# Patient Record
Sex: Female | Born: 1935 | Race: White | Hispanic: No | Smoking: Never smoker
Health system: Southern US, Community
[De-identification: ages and names within clinical notes are randomized; demographics above are authoritative.]

## PROBLEM LIST (undated history)

## (undated) DIAGNOSIS — R627 Adult failure to thrive: Secondary | ICD-10-CM

## (undated) DIAGNOSIS — E785 Hyperlipidemia, unspecified: Secondary | ICD-10-CM

## (undated) DIAGNOSIS — F329 Major depressive disorder, single episode, unspecified: Secondary | ICD-10-CM

## (undated) DIAGNOSIS — K5900 Constipation, unspecified: Secondary | ICD-10-CM

## (undated) DIAGNOSIS — G219 Secondary parkinsonism, unspecified: Secondary | ICD-10-CM

## (undated) DIAGNOSIS — K210 Gastro-esophageal reflux disease with esophagitis: Secondary | ICD-10-CM

## (undated) DIAGNOSIS — F0150 Vascular dementia without behavioral disturbance: Secondary | ICD-10-CM

## (undated) DIAGNOSIS — N12 Tubulo-interstitial nephritis, not specified as acute or chronic: Secondary | ICD-10-CM

## (undated) DIAGNOSIS — K769 Liver disease, unspecified: Secondary | ICD-10-CM

## (undated) HISTORY — DX: Constipation, unspecified: K59.00

## (undated) HISTORY — DX: Liver disease, unspecified: K76.9

## (undated) HISTORY — DX: Gastro-esophageal reflux disease with esophagitis: K21.0

## (undated) HISTORY — DX: Hyperlipidemia, unspecified: E78.5

## (undated) HISTORY — DX: Major depressive disorder, single episode, unspecified: F32.9

## (undated) HISTORY — DX: Tubulo-interstitial nephritis, not specified as acute or chronic: N12

## (undated) HISTORY — DX: Vascular dementia without behavioral disturbance: F01.50

## (undated) HISTORY — DX: Adult failure to thrive: R62.7

## (undated) HISTORY — DX: Secondary parkinsonism, unspecified: G21.9

---

## 2000-11-26 ENCOUNTER — Emergency Department (HOSPITAL_COMMUNITY): Admission: EM | Admit: 2000-11-26 | Discharge: 2000-11-26 | Payer: Self-pay | Admitting: Emergency Medicine

## 2001-04-13 ENCOUNTER — Encounter: Payer: Self-pay | Admitting: Emergency Medicine

## 2001-04-14 ENCOUNTER — Inpatient Hospital Stay (HOSPITAL_COMMUNITY): Admission: EM | Admit: 2001-04-14 | Discharge: 2001-04-16 | Payer: Self-pay | Admitting: Emergency Medicine

## 2001-04-15 ENCOUNTER — Encounter: Payer: Self-pay | Admitting: Orthopedic Surgery

## 2002-10-28 ENCOUNTER — Inpatient Hospital Stay (HOSPITAL_COMMUNITY): Admission: EM | Admit: 2002-10-28 | Discharge: 2002-11-17 | Payer: Self-pay | Admitting: Emergency Medicine

## 2002-10-28 ENCOUNTER — Encounter: Payer: Self-pay | Admitting: Emergency Medicine

## 2002-10-28 ENCOUNTER — Encounter: Payer: Self-pay | Admitting: Cardiology

## 2002-10-28 ENCOUNTER — Encounter: Payer: Self-pay | Admitting: Internal Medicine

## 2002-10-29 ENCOUNTER — Encounter: Payer: Self-pay | Admitting: Internal Medicine

## 2002-10-30 ENCOUNTER — Encounter: Payer: Self-pay | Admitting: Internal Medicine

## 2002-10-30 ENCOUNTER — Encounter: Payer: Self-pay | Admitting: Pulmonary Disease

## 2002-10-31 ENCOUNTER — Encounter: Payer: Self-pay | Admitting: Internal Medicine

## 2002-11-01 ENCOUNTER — Encounter: Payer: Self-pay | Admitting: Internal Medicine

## 2002-11-02 ENCOUNTER — Encounter: Payer: Self-pay | Admitting: Internal Medicine

## 2002-11-03 ENCOUNTER — Encounter: Payer: Self-pay | Admitting: Internal Medicine

## 2002-11-05 ENCOUNTER — Encounter: Payer: Self-pay | Admitting: Internal Medicine

## 2002-11-06 ENCOUNTER — Encounter: Payer: Self-pay | Admitting: Internal Medicine

## 2002-11-09 ENCOUNTER — Encounter: Payer: Self-pay | Admitting: Internal Medicine

## 2002-11-10 ENCOUNTER — Encounter: Payer: Self-pay | Admitting: Internal Medicine

## 2002-11-13 ENCOUNTER — Encounter: Payer: Self-pay | Admitting: Internal Medicine

## 2002-11-18 ENCOUNTER — Encounter: Payer: Self-pay | Admitting: Emergency Medicine

## 2002-11-18 ENCOUNTER — Emergency Department (HOSPITAL_COMMUNITY): Admission: EM | Admit: 2002-11-18 | Discharge: 2002-11-18 | Payer: Self-pay | Admitting: Emergency Medicine

## 2002-11-22 ENCOUNTER — Emergency Department (HOSPITAL_COMMUNITY): Admission: EM | Admit: 2002-11-22 | Discharge: 2002-11-23 | Payer: Self-pay | Admitting: Emergency Medicine

## 2002-11-22 ENCOUNTER — Encounter: Payer: Self-pay | Admitting: Emergency Medicine

## 2003-03-03 ENCOUNTER — Encounter: Payer: Self-pay | Admitting: Emergency Medicine

## 2003-03-03 ENCOUNTER — Emergency Department (HOSPITAL_COMMUNITY): Admission: EM | Admit: 2003-03-03 | Discharge: 2003-03-03 | Payer: Self-pay | Admitting: Emergency Medicine

## 2004-09-08 ENCOUNTER — Emergency Department (HOSPITAL_COMMUNITY): Admission: EM | Admit: 2004-09-08 | Discharge: 2004-09-08 | Payer: Self-pay | Admitting: *Deleted

## 2007-12-23 ENCOUNTER — Emergency Department (HOSPITAL_BASED_OUTPATIENT_CLINIC_OR_DEPARTMENT_OTHER): Admission: EM | Admit: 2007-12-23 | Discharge: 2007-12-23 | Payer: Self-pay | Admitting: Emergency Medicine

## 2008-03-14 ENCOUNTER — Emergency Department (HOSPITAL_BASED_OUTPATIENT_CLINIC_OR_DEPARTMENT_OTHER): Admission: EM | Admit: 2008-03-14 | Discharge: 2008-03-14 | Payer: Self-pay | Admitting: Emergency Medicine

## 2008-04-18 ENCOUNTER — Emergency Department (HOSPITAL_BASED_OUTPATIENT_CLINIC_OR_DEPARTMENT_OTHER): Admission: EM | Admit: 2008-04-18 | Discharge: 2008-04-18 | Payer: Self-pay | Admitting: Emergency Medicine

## 2008-10-08 ENCOUNTER — Emergency Department (HOSPITAL_BASED_OUTPATIENT_CLINIC_OR_DEPARTMENT_OTHER): Admission: EM | Admit: 2008-10-08 | Discharge: 2008-10-08 | Payer: Self-pay | Admitting: Emergency Medicine

## 2009-02-03 ENCOUNTER — Ambulatory Visit: Payer: Self-pay | Admitting: Diagnostic Radiology

## 2009-02-03 ENCOUNTER — Emergency Department (HOSPITAL_BASED_OUTPATIENT_CLINIC_OR_DEPARTMENT_OTHER): Admission: EM | Admit: 2009-02-03 | Discharge: 2009-02-03 | Payer: Self-pay | Admitting: Emergency Medicine

## 2009-04-14 ENCOUNTER — Ambulatory Visit: Payer: Self-pay | Admitting: Diagnostic Radiology

## 2009-04-14 ENCOUNTER — Emergency Department (HOSPITAL_BASED_OUTPATIENT_CLINIC_OR_DEPARTMENT_OTHER): Admission: EM | Admit: 2009-04-14 | Discharge: 2009-04-14 | Payer: Self-pay | Admitting: Emergency Medicine

## 2009-05-26 ENCOUNTER — Ambulatory Visit: Payer: Self-pay | Admitting: Diagnostic Radiology

## 2009-05-26 ENCOUNTER — Emergency Department (HOSPITAL_BASED_OUTPATIENT_CLINIC_OR_DEPARTMENT_OTHER): Admission: EM | Admit: 2009-05-26 | Discharge: 2009-05-26 | Payer: Self-pay | Admitting: Emergency Medicine

## 2009-08-26 ENCOUNTER — Emergency Department (HOSPITAL_BASED_OUTPATIENT_CLINIC_OR_DEPARTMENT_OTHER): Admission: EM | Admit: 2009-08-26 | Discharge: 2009-08-26 | Payer: Self-pay | Admitting: Emergency Medicine

## 2009-08-26 ENCOUNTER — Ambulatory Visit: Payer: Self-pay | Admitting: Diagnostic Radiology

## 2009-11-30 ENCOUNTER — Ambulatory Visit: Payer: Self-pay | Admitting: Diagnostic Radiology

## 2009-11-30 ENCOUNTER — Encounter: Payer: Self-pay | Admitting: Emergency Medicine

## 2009-12-01 ENCOUNTER — Inpatient Hospital Stay (HOSPITAL_COMMUNITY): Admission: EM | Admit: 2009-12-01 | Discharge: 2009-12-21 | Payer: Self-pay | Admitting: Internal Medicine

## 2009-12-22 DIAGNOSIS — G219 Secondary parkinsonism, unspecified: Secondary | ICD-10-CM

## 2009-12-22 DIAGNOSIS — F0150 Vascular dementia without behavioral disturbance: Secondary | ICD-10-CM

## 2009-12-22 HISTORY — DX: Secondary parkinsonism, unspecified: G21.9

## 2009-12-22 HISTORY — DX: Vascular dementia, unspecified severity, without behavioral disturbance, psychotic disturbance, mood disturbance, and anxiety: F01.50

## 2010-02-19 DIAGNOSIS — F32A Depression, unspecified: Secondary | ICD-10-CM

## 2010-02-19 DIAGNOSIS — E785 Hyperlipidemia, unspecified: Secondary | ICD-10-CM

## 2010-02-19 HISTORY — DX: Depression, unspecified: F32.A

## 2010-02-19 HISTORY — DX: Hyperlipidemia, unspecified: E78.5

## 2010-03-20 ENCOUNTER — Emergency Department (HOSPITAL_COMMUNITY): Admission: EM | Admit: 2010-03-20 | Discharge: 2010-03-20 | Payer: Self-pay | Admitting: *Deleted

## 2010-03-29 DIAGNOSIS — K2100 Gastro-esophageal reflux disease with esophagitis, without bleeding: Secondary | ICD-10-CM

## 2010-03-29 DIAGNOSIS — K5900 Constipation, unspecified: Secondary | ICD-10-CM

## 2010-03-29 HISTORY — DX: Constipation, unspecified: K59.00

## 2010-03-29 HISTORY — DX: Gastro-esophageal reflux disease with esophagitis, without bleeding: K21.00

## 2010-07-02 DIAGNOSIS — K769 Liver disease, unspecified: Secondary | ICD-10-CM

## 2010-07-02 HISTORY — DX: Liver disease, unspecified: K76.9

## 2010-08-27 ENCOUNTER — Inpatient Hospital Stay (HOSPITAL_COMMUNITY)
Admission: EM | Admit: 2010-08-27 | Discharge: 2010-09-08 | DRG: 871 | Disposition: A | Discharge status: Hospice | Payer: Medicare Other | Attending: Internal Medicine | Admitting: Internal Medicine

## 2010-08-27 ENCOUNTER — Emergency Department (HOSPITAL_COMMUNITY): Payer: Medicare Other

## 2010-08-27 DIAGNOSIS — R197 Diarrhea, unspecified: Secondary | ICD-10-CM | POA: Diagnosis present

## 2010-08-27 DIAGNOSIS — Z66 Do not resuscitate: Secondary | ICD-10-CM | POA: Diagnosis present

## 2010-08-27 DIAGNOSIS — A419 Sepsis, unspecified organism: Secondary | ICD-10-CM | POA: Diagnosis present

## 2010-08-27 DIAGNOSIS — R627 Adult failure to thrive: Secondary | ICD-10-CM | POA: Diagnosis present

## 2010-08-27 DIAGNOSIS — F209 Schizophrenia, unspecified: Secondary | ICD-10-CM | POA: Diagnosis present

## 2010-08-27 DIAGNOSIS — Z87440 Personal history of urinary (tract) infections: Secondary | ICD-10-CM

## 2010-08-27 DIAGNOSIS — E872 Acidosis, unspecified: Secondary | ICD-10-CM | POA: Diagnosis present

## 2010-08-27 DIAGNOSIS — F0390 Unspecified dementia without behavioral disturbance: Secondary | ICD-10-CM | POA: Diagnosis present

## 2010-08-27 DIAGNOSIS — Z515 Encounter for palliative care: Secondary | ICD-10-CM

## 2010-08-27 DIAGNOSIS — E46 Unspecified protein-calorie malnutrition: Secondary | ICD-10-CM | POA: Diagnosis present

## 2010-08-27 DIAGNOSIS — E871 Hypo-osmolality and hyponatremia: Secondary | ICD-10-CM | POA: Diagnosis present

## 2010-08-27 DIAGNOSIS — R4182 Altered mental status, unspecified: Secondary | ICD-10-CM | POA: Diagnosis present

## 2010-08-27 DIAGNOSIS — N12 Tubulo-interstitial nephritis, not specified as acute or chronic: Secondary | ICD-10-CM | POA: Diagnosis present

## 2010-08-27 DIAGNOSIS — N179 Acute kidney failure, unspecified: Secondary | ICD-10-CM | POA: Diagnosis present

## 2010-08-27 DIAGNOSIS — R6520 Severe sepsis without septic shock: Secondary | ICD-10-CM | POA: Diagnosis present

## 2010-08-27 DIAGNOSIS — A4151 Sepsis due to Escherichia coli [E. coli]: Principal | ICD-10-CM | POA: Diagnosis present

## 2010-08-27 DIAGNOSIS — E876 Hypokalemia: Secondary | ICD-10-CM | POA: Diagnosis present

## 2010-08-27 DIAGNOSIS — I10 Essential (primary) hypertension: Secondary | ICD-10-CM | POA: Diagnosis present

## 2010-08-27 DIAGNOSIS — E162 Hypoglycemia, unspecified: Secondary | ICD-10-CM | POA: Diagnosis not present

## 2010-08-27 DIAGNOSIS — R64 Cachexia: Secondary | ICD-10-CM | POA: Diagnosis present

## 2010-08-27 DIAGNOSIS — E785 Hyperlipidemia, unspecified: Secondary | ICD-10-CM | POA: Diagnosis present

## 2010-08-27 DIAGNOSIS — R1310 Dysphagia, unspecified: Secondary | ICD-10-CM | POA: Diagnosis present

## 2010-08-27 HISTORY — DX: Adult failure to thrive: R62.7

## 2010-08-27 LAB — PROCALCITONIN: Procalcitonin: 69.52 ng/mL

## 2010-08-27 LAB — URINE MICROSCOPIC-ADD ON

## 2010-08-27 LAB — URINALYSIS, ROUTINE W REFLEX MICROSCOPIC
Ketones, ur: 15 mg/dL — AB
Nitrite: POSITIVE — AB
Protein, ur: 100 mg/dL — AB
Urobilinogen, UA: 1 mg/dL (ref 0.0–1.0)

## 2010-08-28 ENCOUNTER — Inpatient Hospital Stay (HOSPITAL_COMMUNITY): Payer: Medicare Other

## 2010-08-28 LAB — DIFFERENTIAL
Eosinophils Relative: 1 % (ref 0–5)
Lymphocytes Relative: 8 % — ABNORMAL LOW (ref 12–46)
Lymphs Abs: 0.6 10*3/uL — ABNORMAL LOW (ref 0.7–4.0)
Monocytes Absolute: 0.4 10*3/uL (ref 0.1–1.0)
Monocytes Relative: 5 % (ref 3–12)
Neutro Abs: 6.2 10*3/uL (ref 1.7–7.7)
Neutrophils Relative %: 85 % — ABNORMAL HIGH (ref 43–77)

## 2010-08-28 LAB — CBC
HCT: 31.3 % — ABNORMAL LOW (ref 36.0–46.0)
Hemoglobin: 9.8 g/dL — ABNORMAL LOW (ref 12.0–15.0)
MCH: 29.7 pg (ref 26.0–34.0)
MCV: 91.3 fL (ref 78.0–100.0)
MCV: 92.1 fL (ref 78.0–100.0)
Platelets: 135 10*3/uL — ABNORMAL LOW (ref 150–400)
RBC: 3.3 MIL/uL — ABNORMAL LOW (ref 3.87–5.11)
RBC: 3.43 MIL/uL — ABNORMAL LOW (ref 3.87–5.11)
WBC: 23.8 10*3/uL — ABNORMAL HIGH (ref 4.0–10.5)

## 2010-08-28 LAB — BRAIN NATRIURETIC PEPTIDE
Pro B Natriuretic peptide (BNP): 307 pg/mL — ABNORMAL HIGH (ref 0.0–100.0)
Pro B Natriuretic peptide (BNP): 351 pg/mL — ABNORMAL HIGH (ref 0.0–100.0)

## 2010-08-28 LAB — CORTISOL: Cortisol, Plasma: 113.2 ug/dL

## 2010-08-28 LAB — CARDIAC PANEL(CRET KIN+CKTOT+MB+TROPI)
Relative Index: 13.2 — ABNORMAL HIGH (ref 0.0–2.5)
Total CK: 432 U/L — ABNORMAL HIGH (ref 7–177)
Troponin I: 2.4 ng/mL (ref 0.00–0.06)
Troponin I: 3.46 ng/mL (ref 0.00–0.06)

## 2010-08-28 LAB — BASIC METABOLIC PANEL
BUN: 22 mg/dL (ref 6–23)
Calcium: 7.2 mg/dL — ABNORMAL LOW (ref 8.4–10.5)
GFR calc non Af Amer: 40 mL/min — ABNORMAL LOW (ref >60)
Potassium: 3.4 mEq/L — ABNORMAL LOW (ref 3.5–5.1)

## 2010-08-28 LAB — GLUCOSE, CAPILLARY
Glucose-Capillary: 121 mg/dL — ABNORMAL HIGH (ref 70–99)
Glucose-Capillary: 123 mg/dL — ABNORMAL HIGH (ref 70–99)
Glucose-Capillary: 123 mg/dL — ABNORMAL HIGH (ref 70–99)
Glucose-Capillary: 128 mg/dL — ABNORMAL HIGH (ref 70–99)

## 2010-08-28 LAB — APTT: aPTT: 34 seconds (ref 24–37)

## 2010-08-28 LAB — CLOSTRIDIUM DIFFICILE BY PCR: Toxigenic C. Difficile by PCR: NEGATIVE

## 2010-08-29 DIAGNOSIS — E876 Hypokalemia: Secondary | ICD-10-CM

## 2010-08-29 DIAGNOSIS — E782 Mixed hyperlipidemia: Secondary | ICD-10-CM

## 2010-08-29 DIAGNOSIS — R6521 Severe sepsis with septic shock: Secondary | ICD-10-CM

## 2010-08-29 DIAGNOSIS — A419 Sepsis, unspecified organism: Secondary | ICD-10-CM

## 2010-08-29 DIAGNOSIS — R6520 Severe sepsis without septic shock: Secondary | ICD-10-CM

## 2010-08-29 DIAGNOSIS — G9340 Encephalopathy, unspecified: Secondary | ICD-10-CM

## 2010-08-29 LAB — LEGIONELLA ANTIGEN, URINE: Legionella Antigen, Urine: NEGATIVE

## 2010-08-29 LAB — URINE CULTURE

## 2010-08-29 LAB — GLUCOSE, CAPILLARY
Glucose-Capillary: 103 mg/dL — ABNORMAL HIGH (ref 70–99)
Glucose-Capillary: 118 mg/dL — ABNORMAL HIGH (ref 70–99)
Glucose-Capillary: 139 mg/dL — ABNORMAL HIGH (ref 70–99)

## 2010-08-29 LAB — URINE MICROSCOPIC-ADD ON

## 2010-08-29 LAB — BASIC METABOLIC PANEL
Calcium: 7.5 mg/dL — ABNORMAL LOW (ref 8.4–10.5)
GFR calc Af Amer: 56 mL/min — ABNORMAL LOW (ref >60)
GFR calc non Af Amer: 46 mL/min — ABNORMAL LOW (ref >60)
Sodium: 150 mEq/L — ABNORMAL HIGH (ref 135–145)

## 2010-08-29 LAB — PHOSPHORUS: Phosphorus: 2.8 mg/dL (ref 2.3–4.6)

## 2010-08-29 LAB — URINALYSIS, ROUTINE W REFLEX MICROSCOPIC
Bilirubin Urine: NEGATIVE
Hgb urine dipstick: NEGATIVE
Specific Gravity, Urine: 1.026 (ref 1.005–1.030)
pH: 5 (ref 5.0–8.0)

## 2010-08-30 LAB — BASIC METABOLIC PANEL
CO2: 22 mEq/L (ref 19–32)
Calcium: 8 mg/dL — ABNORMAL LOW (ref 8.4–10.5)
Chloride: 125 mEq/L — ABNORMAL HIGH (ref 96–112)
GFR calc Af Amer: 50 mL/min — ABNORMAL LOW (ref >60)
Sodium: 151 mEq/L — ABNORMAL HIGH (ref 135–145)

## 2010-08-30 LAB — CBC
Hemoglobin: 12.2 g/dL (ref 12.0–15.0)
MCHC: 32.2 g/dL (ref 30.0–36.0)
Platelets: 142 10*3/uL — ABNORMAL LOW (ref 150–400)
RBC: 4.17 MIL/uL (ref 3.87–5.11)

## 2010-08-30 LAB — GLUCOSE, CAPILLARY: Glucose-Capillary: 78 mg/dL (ref 70–99)

## 2010-08-30 LAB — CULTURE, BLOOD (ROUTINE X 2)

## 2010-08-30 LAB — MAGNESIUM: Magnesium: 2 mg/dL (ref 1.5–2.5)

## 2010-08-30 LAB — PHOSPHORUS: Phosphorus: 3.3 mg/dL (ref 2.3–4.6)

## 2010-08-31 LAB — GLUCOSE, CAPILLARY
Glucose-Capillary: 44 mg/dL — CL (ref 70–99)
Glucose-Capillary: 58 mg/dL — ABNORMAL LOW (ref 70–99)
Glucose-Capillary: 139 mg/dL — ABNORMAL HIGH (ref 70–99)
Glucose-Capillary: 115 mg/dL — ABNORMAL HIGH (ref 70–99)
Glucose-Capillary: 112 mg/dL — ABNORMAL HIGH (ref 70–99)

## 2010-08-31 LAB — BASIC METABOLIC PANEL
Chloride: 123 mEq/L — ABNORMAL HIGH (ref 96–112)
GFR calc Af Amer: 57 mL/min — ABNORMAL LOW (ref >60)
GFR calc non Af Amer: 47 mL/min — ABNORMAL LOW (ref >60)
Potassium: 4 mEq/L (ref 3.5–5.1)
Sodium: 150 mEq/L — ABNORMAL HIGH (ref 135–145)

## 2010-09-01 LAB — GLUCOSE, CAPILLARY
Glucose-Capillary: 90 mg/dL (ref 70–99)
Glucose-Capillary: 106 mg/dL — ABNORMAL HIGH (ref 70–99)
Glucose-Capillary: 117 mg/dL — ABNORMAL HIGH (ref 70–99)
Glucose-Capillary: 91 mg/dL (ref 70–99)
Glucose-Capillary: 98 mg/dL (ref 70–99)

## 2010-09-01 LAB — BASIC METABOLIC PANEL WITH GFR
BUN: 12 mg/dL (ref 6–23)
CO2: 23 meq/L (ref 19–32)
Calcium: 7 mg/dL — ABNORMAL LOW (ref 8.4–10.5)
Chloride: 122 meq/L — ABNORMAL HIGH (ref 96–112)
Creatinine, Ser: 0.78 mg/dL (ref 0.4–1.2)
GFR calc non Af Amer: >60 mL/min
Glucose, Bld: 129 mg/dL — ABNORMAL HIGH (ref 70–99)
Potassium: 3 meq/L — ABNORMAL LOW (ref 3.5–5.1)
Sodium: 151 meq/L — ABNORMAL HIGH (ref 135–145)

## 2010-09-01 LAB — CBC
Hemoglobin: 10.7 g/dL — ABNORMAL LOW (ref 12.0–15.0)
MCHC: 32.6 g/dL (ref 30.0–36.0)
RBC: 3.6 MIL/uL — ABNORMAL LOW (ref 3.87–5.11)
WBC: 14.1 10*3/uL — ABNORMAL HIGH (ref 4.0–10.5)

## 2010-09-02 LAB — GLUCOSE, CAPILLARY
Glucose-Capillary: 123 mg/dL — ABNORMAL HIGH (ref 70–99)
Glucose-Capillary: 121 mg/dL — ABNORMAL HIGH (ref 70–99)
Glucose-Capillary: 90 mg/dL (ref 70–99)
Glucose-Capillary: 143 mg/dL — ABNORMAL HIGH (ref 70–99)

## 2010-09-02 LAB — BASIC METABOLIC PANEL
BUN: 7 mg/dL (ref 6–23)
CO2: 28 mEq/L (ref 19–32)
Calcium: 7.1 mg/dL — ABNORMAL LOW (ref 8.4–10.5)
Glucose, Bld: 119 mg/dL — ABNORMAL HIGH (ref 70–99)
Sodium: 150 mEq/L — ABNORMAL HIGH (ref 135–145)

## 2010-09-03 LAB — CULTURE, BLOOD (ROUTINE X 2): Culture: NO GROWTH

## 2010-09-05 ENCOUNTER — Inpatient Hospital Stay (HOSPITAL_COMMUNITY): Payer: Medicare Other

## 2010-09-05 LAB — DIFFERENTIAL
Basophils Absolute: 0 10*3/uL (ref 0.0–0.1)
Basophils Relative: 0 % (ref 0–1)
Eosinophils Relative: 0 % (ref 0–5)
Monocytes Absolute: 0.3 10*3/uL (ref 0.1–1.0)
Neutro Abs: 10.2 10*3/uL — ABNORMAL HIGH (ref 1.7–7.7)

## 2010-09-05 LAB — COMPREHENSIVE METABOLIC PANEL
ALT: 14 U/L (ref 0–35)
AST: 16 U/L (ref 0–37)
Calcium: 6.6 mg/dL — ABNORMAL LOW (ref 8.4–10.5)
GFR calc Af Amer: >60 mL/min (ref >60)
Sodium: 145 mEq/L (ref 135–145)
Total Protein: 4.3 g/dL — ABNORMAL LOW (ref 6.0–8.3)

## 2010-09-05 LAB — CBC
MCHC: 33.2 g/dL (ref 30.0–36.0)
RDW: 14.8 % (ref 11.5–15.5)

## 2010-09-10 NOTE — Discharge Summary (Signed)
  [name], [name]                  ACCOUNT NO.:  [id number]  MEDICAL RECORD NO.:  [id number]           PATIENT TYPE:  I  LOCATION:  4501                         FACILITY:  [facility]  PHYSICIAN:  Peggye Pitt, M.D. DATE OF BIRTH:  May 04, 1927  DATE OF ADMISSION:  08/27/2010 DATE OF DISCHARGE:  09/08/2010                        DISCHARGE SUMMARY - REFERRING   DISCHARGE DIAGNOSES: 1. Urinary tract infection with Escherichia coli sepsis. 2. Hyponatremia/dehydration. 3. Altered mental status. 4. Advanced dementia. 5. History of hypertension. 6. Hyperlipidemia. 7. History of schizophrenia. 8. Failure to thrive.  DISCHARGE MEDICATIONS: 1. Ativan 1 mg every 2 hours as needed for anxiety or agitation. 2. Roxanol 10 mg every 4 hours as needed for pain. 3. Tylenol 650 mg suppository or p.o. every 6 hours as needed for     fever.  DISPOSITION AND FOLLOWUP:  Mrs. Mcerlean will be discharged back to Skilled [facility] Facility today.  Please note that she has been made full comfort care measures.  Please note that the patient is also a legal ward of the state and I personally spoke with her state guardian Ms. Rolly Pancake on September 06, 2010, and did confirm with her the plan for palliation and full comfort care and no further transfers to the hospitalization if comfort measures are able to be made at the Skilled Nursing Facility. Please make sure that hospice services does follow Mrs. Walle upon arrival to Skilled Facility.  HISTORY AND PHYSICAL:  For complete details please see dictation by Dr. Marchelle Gearing on August 28, 2010, but in brief Mrs. Borromeo was an 77 year old Caucasian lady initially admitted to the hospital on August 28, 2010. She was initially admitted by the ICU Team given her presentation with lactic acidosis and septic shock, presumed to be from pyelonephritis. She was then transferred to our service on August 31, 2010.  Continued discussions with her health care power of  attorney and legal guardian of the state ensued, Palliative Care consultation was obtained.  Given her severe dementia and poor quality of life, it was decided to make her comfort care. Mrs. Hatler has been doing a quite well over the past couple days, has been eating.  I do not think it is feasible to keep her in the hospital as I suspect that she will have somewhat of a prolonged survival, so instead we will send her back to Skilled Nursing Facility today where hospice will continue to follow her there.     Peggye Pitt, M.D.     EH/MEDQ  D:  09/08/2010  T:  09/08/2010  Job:  [id number]  Electronically Signed by Peggye Pitt M.D. on 09/10/2010 30:86:57 AM

## 2010-09-11 LAB — CULTURE, BLOOD (ROUTINE X 2)
Culture  Setup Time: 201202150916
Culture  Setup Time: 201202150916
Culture: NO GROWTH

## 2010-09-19 NOTE — H&P (Signed)
[name], [name]                  ACCOUNT NO.:  [id number]  MEDICAL RECORD NO.:  [id number]           PATIENT TYPE:  E  LOCATION:  [facility]                         FACILITY:  [facility]  PHYSICIAN:  Kalman Shan, MD   DATE OF BIRTH:  08-23-1926  DATE OF ADMISSION:  08/27/2010 DATE OF DISCHARGE:                             HISTORY & PHYSICAL   REFERRING PHYSICIAN:  Rhae Lerner. Margretta Ditty, MD, of the emergency room.  TIME OF EVALUATION:  From 1 a.m. to 1:45 a.m. on August 28, 2010, 45- minute critical care evaluation.  REASON FOR REFERRAL: 1. Septic shock, probably urinary tract infection. 2. Lactic acidosis. 3. Altered mental status.  HISTORY OF PRESENT ILLNESS:  [name] is an 77 year old female ward of the state with the advanced dementia and baseline confusion, multiple recurrent urinary tract infections, and schizophrenia who according to the ER physician and nurse was at a baseline state of health, who is being treated with ciprofloxacin for urinary tract infections, somewhere around August 20, 2010, but for the past week, it has had worsening failure to thrive, decreased p.o. intake, and worsening diarrhea.  A C. diff has been sent and this has been pending.  No specific treatment for C. diff was initiated.  Then on August 27, 2010, it was noted to have altered mental status, lethargy, and febrile and was unresponsive or less responsive in the emergency room and was also intermittently agitated, pulling out her IVs, so she was sent to the emergency department.  In the emergency department, she met criteria for SIRS with temperature of 102.5, tachycardia anywhere from 120-160, and hypotension with a systolic of 72 and mild tachypnea with a respiratory rate of 22. However, her white count was normal.  Initially, she got a Cardizem bolus.  She maintained her blood pressure, but subsequently repeated Cardizem, and she became hypotensive.  She subsequently received 4 liters of  saline and she continued to remain hypotensive with a help of three strong peripheral IVs.  She is now on Levophed, but she is still hypotensive with a systolic of 74 and exhibiting source criteria.  She is intermittently confused and agitated.  She is already received vanc and Zosyn and her urinalysis is of normal, suggestive of UTI.  Pulmonary Critical Care has been asked to admit.  PAST MEDICAL HISTORY: 1. Right hip fracture status post ORIF. 2. June 98119, is the last admission for right hip fracture, E. coli     and enterococcus UTI. 3. Advanced dementia, schizophrenia, hypertension, hyperlipidemia.  CODE STATUS:  According to Iffy, who is the overnight nurse at [facility] at [facility] where patient lives, contact number [phone], the patient is a full code. Apparently there is son, but patient has been a ward of the [place].  The contact numbers for the ward of the state are Janna Arch, who is the Division Director at cell phone number [phone] and Roxana Hires [phone].  There is reportedly some family member called Weston Anna, in [place] whose phone number is [phone].  I have contacted these people, but I have not been able to get hold  of DSS, left message to call back.  FAMILY HISTORY:  Currently unknown.  REVIEW OF SYSTEMS:  As per history of present illness.  ALLERGIES:  Per E-chart no known allergies.  MEDICATION LIST:  According to the nursing home records include: 1. Calazime applied to the sacrum. 2. Remeron for depression. 3. Zoloft for depression. 4. Simvastatin and ciprofloxacin started on August 26, 2010. 5. Tylenol p.r.n. 6. Aricept. 7. Claritin. 8. Cranberry juice for UTI prophylaxis.  PHYSICAL EXAMINATION:  VITAL SIGNS:  Temperature 100.2 blood pressure 70 systolic over 45 diastolic on Levophed 10 mcg, heart rate of 137, pulse ox of 100%, respiratory rate of 22. GENERAL:  Debilitated frail elderly female lying in bed  intermittently restless, RASS sedation score of +1 to +2. CENTRAL NERVOUS SYSTEM:  She is intermittently responsive, but maintaining her airway, moves all four extremities. HEENT:  Neck is supple.  No neck nodes.  No elevated JVP. CHEST:  She is again cachectic female and thin and bony.  Bilateral air entry is equal and breath sounds are clear except in the basilar crackles. CARDIOVASCULAR:  Tachycardic, regular rate and rhythm. ABDOMEN:  Soft, nontender.  No organomegaly. EXTREMITIES:  No cyanosis, no clubbing, and no edema. EXTERNAL GENITALIA:  Looked normal. BACK:  Not examined. SKIN:  Unknown, if she has any bed sores, but none per history.  LABORATORY VALUES: 1. CBC shows a white count of 7.4, hemoglobin 9.8, platelet count of     72,000.  Of note, her hemoglobin in May of 2011, was 10.9 so roughly her hemoglobin is stable, but a platelet count was essentially normal and so she has new onset thrombocytopenia. 1. BNP is 351. 2. BMET shows a creatinine of 1.08.  Sodium of 115 and potassium of     2.7. 3. Procalcitonin is elevated at 69.5. 4. Lactic acid is 6.6. 5. Urine microscopy shows numerous white cells, red cells, and  bacteria with leukocyte and nitrites positive.  ASSESSMENT AND PLAN: 1. Definite septic shock, the source is urinary tract infection. 2. Electrolyte abnormalities of hyponatremia and hypokalemia. 3. Altered mental status secondary to the above. 4. Baseline underlying advanced dementia, failure to thrive,     malnutrition, and severe cachexia and advanced age. 5. Full code per transfer notes.  PLAN: 1. Agree with vanc and Zosyn. 2. Check C. diff PCR given recent history of diarrhea. 3. Start empiric IV Flagyl. 4. Aggressive fluid hydration through peripheral lines. 5. Recheck lactate for clearance. 6. Track procalcitonin for it's response. 7. Pressors through the peripheral line. 8. Start IV hydrocortisone for stress dose steroids. 9. She is high  risk for central line given her agitation and cachexia.     I think, the overall prognosis here is very poor, given her     advanced dementia, she requires palliative care. I think full     aggressive therapy including central line placement and mechanical     ventilation is potentially harmful and of no benefit in this elderly patient with advanced dementia.  I     have tried to contact the ward of the state and left message to     call back.  In the event she decompensates, then we have to decide     whether she is basically appropriate to place central line and     intubate the patient or not.  For now continue current full medical     therapy. 10. Consult palliative care     Kalman Shan, MD  MR/MEDQ  D:  08/28/2010  T:  08/28/2010  Job:  [id number]  Electronically Signed by Kalman Shan MD on 09/19/2010 11:30:53 AM

## 2010-09-30 DIAGNOSIS — N12 Tubulo-interstitial nephritis, not specified as acute or chronic: Secondary | ICD-10-CM

## 2010-09-30 HISTORY — DX: Tubulo-interstitial nephritis, not specified as acute or chronic: N12

## 2010-10-02 NOTE — Consult Note (Signed)
  NAMERANDOLPH, PAONE                  ACCOUNT NO.:  [id number]  MEDICAL RECORD NO.:  [id number]           PATIENT TYPE:  LOCATION:                                 FACILITY:  PHYSICIAN:  Josuel Koeppen L. Ladona Ridgel, MD  DATE OF BIRTH:  [birth year]  DATE OF CONSULTATION:  08/29/2010 DATE OF DISCHARGE:                                CONSULTATION   Consult is requested by Dr. Tyson Alias.  This nurse practitioner, Lorinda Creed, reviewed medical records, received report from team, assessed the patient, and then spoke by telephone with her appointed guardian at Department of Social Services, Roxana Hires, phone number [phone], to discuss diagnosis, prognosis, goals of care, end-of-life wishes, disposition, and options.  PROBLEM: 1. Urosepsis. 2. End-stage dementia. 3. Acute renal failure. 4. Adult failure to thrive.     a.     Dysphagia, the patient presently with a panda tube. 5. Palliative performance scale 20%.  A detailed discussion was had today regarding advance directives, concepts specific to code status, artificial feeding and hydration, continued use of IV antibiotics, and rehospitalization was had. Difference between an aggressive medical intervention path and a palliative comfort care path for this patient at this time in this situation was detailed.  The natural trajectory and expectations at end- of-life were discussed.  Questions and concerns were addressed.  The values and goals of care important to the patient were attempted to be elicited.  Roxana Hires, the guardian for this patient, understands the patient's condition and overall poor prognosis; however, at this time she cannot make definitive decisions until discussion has had with the supervisor of her department.  She plans to have a discussion today and get back to me as soon as possible regarding specifics to this patient's goals of care.  This nurse practitioner will update the records and notify all  caregivers involved as soon as decisions are made in regard to this patient's goals of care and end-of-life wishes.  Today's discussion was had via telephone and it was impossible to have a timely meeting for this patient and it was facilitated by a telephone conversation.  Total time spent on the unit was 30 minutes, time in 11, time out 11:30, greater than 50% of the time was spent in counseling and coordination of care.  Diagnosis and prognosis was clarified.  Concept of hospice and palliative care was reviewed.  The team approach to our service was offered.  Again, the guardian for this patient is encouraged to call with any questions or concerns, and the Palliative Medicine Team will continue to support holistically.  Any questions or concerns regarding this consult, please call Lorinda Creed at [phone].     Herbert Pun, NP   ______________________________ Katharina Caper. Ladona Ridgel, MD    MCL/MEDQ  D:  08/29/2010  T:  08/30/2010  Job:  [id number]  cc:   Nelda Bucks, MD  Electronically Signed by Lorinda Creed NP on 10/02/2010 04:13:10 PM Electronically Signed by Derenda Mis MD on 10/02/2010 04:27:15 PM

## 2010-10-08 LAB — CBC
HCT: 26.7 % — ABNORMAL LOW (ref 36.0–46.0)
HCT: 27.5 % — ABNORMAL LOW (ref 36.0–46.0)
HCT: 27.7 % — ABNORMAL LOW (ref 36.0–46.0)
HCT: 28.8 % — ABNORMAL LOW (ref 36.0–46.0)
HCT: 31.3 % — ABNORMAL LOW (ref 36.0–46.0)
Hemoglobin: 10.1 g/dL — ABNORMAL LOW (ref 12.0–15.0)
Hemoglobin: 10.9 g/dL — ABNORMAL LOW (ref 12.0–15.0)
MCHC: 34.5 g/dL (ref 30.0–36.0)
MCV: 90 fL (ref 78.0–100.0)
MCV: 90.1 fL (ref 78.0–100.0)
MCV: 90.2 fL (ref 78.0–100.0)
MCV: 91.2 fL (ref 78.0–100.0)
Platelets: 151 10*3/uL (ref 150–400)
Platelets: 162 10*3/uL (ref 150–400)
Platelets: 227 10*3/uL (ref 150–400)
RBC: 3.07 MIL/uL — ABNORMAL LOW (ref 3.87–5.11)
RBC: 3.47 MIL/uL — ABNORMAL LOW (ref 3.87–5.11)
RDW: 13.3 % (ref 11.5–15.5)
RDW: 13.3 % (ref 11.5–15.5)
RDW: 13.8 % (ref 11.5–15.5)
WBC: 3.8 10*3/uL — ABNORMAL LOW (ref 4.0–10.5)
WBC: 4 10*3/uL (ref 4.0–10.5)
WBC: 4.7 10*3/uL (ref 4.0–10.5)

## 2010-10-08 LAB — URINALYSIS, ROUTINE W REFLEX MICROSCOPIC
Bilirubin Urine: NEGATIVE
Glucose, UA: NEGATIVE mg/dL
Glucose, UA: NEGATIVE mg/dL
Hgb urine dipstick: NEGATIVE
Ketones, ur: NEGATIVE mg/dL
Specific Gravity, Urine: 1.018 (ref 1.005–1.030)
pH: 6.5 (ref 5.0–8.0)
pH: 7 (ref 5.0–8.0)

## 2010-10-08 LAB — BASIC METABOLIC PANEL
BUN: 13 mg/dL (ref 6–23)
BUN: 9 mg/dL (ref 6–23)
Calcium: 9.1 mg/dL (ref 8.4–10.5)
Chloride: 106 mEq/L (ref 96–112)
Chloride: 108 mEq/L (ref 96–112)
Creatinine, Ser: 0.74 mg/dL (ref 0.4–1.2)
GFR calc Af Amer: >60 mL/min (ref >60)
GFR calc Af Amer: >60 mL/min (ref >60)
GFR calc non Af Amer: >60 mL/min (ref >60)
GFR calc non Af Amer: >60 mL/min (ref >60)
Glucose, Bld: 106 mg/dL — ABNORMAL HIGH (ref 70–99)
Potassium: 3.7 mEq/L (ref 3.5–5.1)
Potassium: 4.6 mEq/L (ref 3.5–5.1)
Sodium: 140 mEq/L (ref 135–145)
Sodium: 141 mEq/L (ref 135–145)
Sodium: 141 mEq/L (ref 135–145)

## 2010-10-08 LAB — URINE CULTURE
Colony Count: 40000
Colony Count: NO GROWTH
Culture: NO GROWTH

## 2010-10-08 LAB — COMPREHENSIVE METABOLIC PANEL
Alkaline Phosphatase: 71 U/L (ref 39–117)
BUN: 5 mg/dL — ABNORMAL LOW (ref 6–23)
Chloride: 103 mEq/L (ref 96–112)
Creatinine, Ser: 0.86 mg/dL (ref 0.4–1.2)
Glucose, Bld: 91 mg/dL (ref 70–99)
Potassium: 4 mEq/L (ref 3.5–5.1)
Total Bilirubin: 0.8 mg/dL (ref 0.3–1.2)

## 2010-10-08 LAB — URINE MICROSCOPIC-ADD ON

## 2010-10-09 LAB — URINALYSIS, ROUTINE W REFLEX MICROSCOPIC
Bilirubin Urine: NEGATIVE
Glucose, UA: NEGATIVE mg/dL
Ketones, ur: NEGATIVE mg/dL
Nitrite: POSITIVE — AB
Protein, ur: NEGATIVE mg/dL
Specific Gravity, Urine: 1.019 (ref 1.005–1.030)
Urobilinogen, UA: 1 mg/dL (ref 0.0–1.0)
pH: 7 (ref 5.0–8.0)

## 2010-10-09 LAB — BASIC METABOLIC PANEL WITH GFR
BUN: 20 mg/dL (ref 6–23)
CO2: 26 meq/L (ref 19–32)
GFR calc non Af Amer: >60 mL/min (ref >60)
Glucose, Bld: 134 mg/dL — ABNORMAL HIGH (ref 70–99)
Potassium: 4.2 meq/L (ref 3.5–5.1)
Sodium: 145 meq/L (ref 135–145)

## 2010-10-09 LAB — URINE MICROSCOPIC-ADD ON

## 2010-10-09 LAB — DIFFERENTIAL
Basophils Absolute: 0 K/uL (ref 0.0–0.1)
Basophils Relative: 0 % (ref 0–1)
Eosinophils Absolute: 0 K/uL (ref 0.0–0.7)
Eosinophils Relative: 0 % (ref 0–5)
Lymphocytes Relative: 6 % — ABNORMAL LOW (ref 12–46)
Lymphs Abs: 0.4 10*3/uL — ABNORMAL LOW (ref 0.7–4.0)
Monocytes Absolute: 0.3 K/uL (ref 0.1–1.0)
Monocytes Relative: 5 % (ref 3–12)
Neutro Abs: 5.7 10*3/uL (ref 1.7–7.7)
Neutrophils Relative %: 89 % — ABNORMAL HIGH (ref 43–77)

## 2010-10-09 LAB — CARDIAC PANEL(CRET KIN+CKTOT+MB+TROPI)
CK, MB: 0.7 ng/mL (ref 0.3–4.0)
CK, MB: 1.2 ng/mL (ref 0.3–4.0)
Relative Index: 1.3 (ref 0.0–2.5)
Relative Index: INVALID (ref 0.0–2.5)
Total CK: 120 U/L (ref 7–177)
Total CK: 86 U/L (ref 7–177)
Troponin I: 0.01 ng/mL (ref 0.00–0.06)
Troponin I: <0.01 ng/mL (ref 0.00–0.06)

## 2010-10-09 LAB — BASIC METABOLIC PANEL
BUN: 6 mg/dL (ref 6–23)
Calcium: 8.8 mg/dL (ref 8.4–10.5)
Chloride: 106 mEq/L (ref 96–112)
Chloride: 107 mEq/L (ref 96–112)
Creatinine, Ser: 0.6 mg/dL (ref 0.4–1.2)
GFR calc Af Amer: >60 mL/min (ref >60)
Potassium: 3.6 mEq/L (ref 3.5–5.1)
Sodium: 138 mEq/L (ref 135–145)

## 2010-10-09 LAB — COMPREHENSIVE METABOLIC PANEL
AST: 45 U/L — ABNORMAL HIGH (ref 0–37)
Albumin: 3.6 g/dL (ref 3.5–5.2)
Alkaline Phosphatase: 99 U/L (ref 39–117)
Chloride: 107 mEq/L (ref 96–112)
GFR calc Af Amer: >60 mL/min (ref >60)
Potassium: 3.5 mEq/L (ref 3.5–5.1)
Total Bilirubin: 0.4 mg/dL (ref 0.3–1.2)

## 2010-10-09 LAB — CBC
HCT: 37.4 % (ref 36.0–46.0)
Hemoglobin: 12.8 g/dL (ref 12.0–15.0)
MCHC: 34.9 g/dL (ref 30.0–36.0)
MCHC: 34.1 g/dL (ref 30.0–36.0)
MCV: 90.7 fL (ref 78.0–100.0)
MCV: 90.9 fL (ref 78.0–100.0)
Platelets: 146 10*3/uL — ABNORMAL LOW (ref 150–400)
Platelets: 175 10*3/uL (ref 150–400)
Platelets: 183 K/uL (ref 150–400)
RBC: 4.11 MIL/uL (ref 3.87–5.11)
RDW: 13 % (ref 11.5–15.5)
WBC: 4 10*3/uL (ref 4.0–10.5)
WBC: 4.8 10*3/uL (ref 4.0–10.5)
WBC: 6.4 10*3/uL (ref 4.0–10.5)

## 2010-10-09 LAB — URINE CULTURE: Colony Count: >=100000

## 2010-10-09 LAB — GLUCOSE, CAPILLARY
Glucose-Capillary: 120 mg/dL — ABNORMAL HIGH (ref 70–99)
Glucose-Capillary: 150 mg/dL — ABNORMAL HIGH (ref 70–99)

## 2010-10-09 LAB — APTT: aPTT: 30 seconds (ref 24–37)

## 2010-10-09 LAB — PROTIME-INR
INR: 1.07 (ref 0.00–1.49)
Prothrombin Time: 13.8 s (ref 11.6–15.2)

## 2010-10-25 NOTE — Progress Notes (Signed)
NAME:  [name], [name]                  ACCOUNT NO.:  [id number]  MEDICAL RECORD NO.:  [id number]           PATIENT TYPE:  I  LOCATION:  4501                         FACILITY:  [facility]  PHYSICIAN:  Kela Millin, M.D.DATE OF BIRTH:  07-Aug-1926                                PROGRESS NOTE   CURRENT DIAGNOSES: 1. Escherichia coli urinary tract infection with Escherichia coli     sepsis. 2. Hypernatremia/dehydration. 3. Altered mental status. 4. Advanced dementia. 5. History of schizophrenia. 6. History of hypertension. 7. History of hyperlipidemia. 8. Failure to thrive.  PROCEDURES AND STUDIES: 1. Chest x-ray on 02/06 - interstitial accentuation in both lungs is     increased compared to the prior exam and could represent     interstitial edema, drug reaction, or atypical edema.  No air space     opacity to suggest bacterial pneumonia is identified. 2. Followup chest x-ray on August 28, 2010 - interval placement of     enteric tube, partially visualized and coiled in the stomach.     Interval increase in pulmonary edema.  CONSULTATIONS: 1. The patient initially admitted to [facility] Critical Care Team. 2. Palliative care - Dr. Derenda Mis.  BRIEF HISTORY: The patient is an 77 year old white female, ward of this state, with advanced dementia and a baseline confusion as well as multiple/recurrent urinary tract infections and schizophrenia who presented with decreased p.o. intake and worsening diarrhea.  It was noted that the patient was being treated for a urinary tract infection with Cipro prior to this admission and in the week prior to presentation, she worsened with failure to thrive and the above symptoms.  It was reported that a C. diff was sent but was pending and she had not been started on any treatment for the C. diff.  On February 6, it was noted that she developed altered mental status becoming lethargic, was afebrile, with decreased responsiveness in the ED, and  also was intermittently agitated, pulling out her IVs.  She was evaluated in the ED and met SIRS criteria.  She was noted to be tachycardic with heart rate in the 120s to 130s and initially a Cardizem bolus was given.  She maintained her blood pressure but subsequently after the Cardizem was repeated she became hypotensive.  She was then given 4 L of normal saline but continued to be hypotensive.  She was then started on Levophed but even then the hypotension was persisting with a systolic of 74.  She was given IV vancomycin and Zosyn in the ED and admitted to the Critical Care team.  HOSPITAL COURSE: 1. E. coli urinary tract infection with E. coli sepsis/septic shock. -     Upon admission blood and urine cultures were obtained and a C. diff     PCR as well.  The patient was maintained on the vancomycin and     Zosyn per Critical Care and also added IV Flagyl.  She was hydrated     aggressively with IV fluids and also started on stress dose IV     hydrocortisone.  The patient's lactic acid level was elevated  at     6.6 and a procalcitonin of 69.5.  Critical care followed and     tracked the procalcitonin levels for response to treatments.  Dr.     Marchelle Gearing indicated that overall, her prognosis is very poor given     her advanced dementia and he stated that full aggressive therapy     including central line placement and mechanical ventilation was     futile in this patient.  He followed and consulted Palliative Care     and on August 29, 2010, the Palliative Care staff spoke with the     patient's appointed guardian at the Department of Social Services.     They had a detailed discussion regarding advanced directives, code     status, artificial feeding, hydration, re-hospitalization, and the     difference between aggressive medical intervention and     palliative/comfort care.  Questions and concerns were addressed.     Following that meeting they indicated that the patient's  guardian     would talk with her supervisor regarding the final decision on the     issues they had discussed.  The patient's urine cultures grew E.     coli that was sensitive to Rocephin and her blood cultures also     grew E. coli.  CCM changed the patient's antibiotics to Rocephin.     The patient's hypotension resolved.  The patient was then     transferred to the medical floor and Palliative Care has continued     to follow the patient.  Ms. Holshouser has continued to be minimally     responsive, she also developed dyspneic episodes and was mottled.     A followup call with the Department of Social Services by Dr.     Ladona Ridgel was done on 02/12 and following that her NG tube was     discontinued and the patient started on Ativan and morphine p.r.n.     for dyspnea and anxiety and comfort care was initiated on 02/12.     Per DSS her antibiotics have also been continued for comfort. 2. Hypernatremia/dehydration - The patient has been receiving     hydration with hypotonic fluids and her last sodium on 02/12 was     still elevated at 150.  As discussed above, comfort care was     initiated on 02/12 and she has been maintained on her IV fluids. 3. Dysphagia - The patient had a an NG tube placed initially but     following the discussion with the Department of Social Services on     02/12 per Dr. Derenda Mis, the NG tube was discontinued. 4. End-stage dementia. 5. History of schizophrenia. The rounding hospitalist to follow and dictate the rest of her hospital stay.     Kela Millin, M.D.     ACV/MEDQ  D:  09/04/2010  T:  09/04/2010  Job:  [id number]  Electronically Signed by Donnalee Curry M.D. on 10/25/2010 08:17:21 AM

## 2010-10-28 LAB — DIFFERENTIAL
Basophils Relative: 1 % (ref 0–1)
Eosinophils Absolute: 0 10*3/uL (ref 0.0–0.7)
Eosinophils Relative: 1 % (ref 0–5)
Monocytes Absolute: 0.2 10*3/uL (ref 0.1–1.0)
Monocytes Relative: 4 % (ref 3–12)
Neutrophils Relative %: 72 % (ref 43–77)

## 2010-10-28 LAB — BASIC METABOLIC PANEL
CO2: 29 mEq/L (ref 19–32)
Chloride: 107 mEq/L (ref 96–112)
Creatinine, Ser: 0.8 mg/dL (ref 0.4–1.2)
GFR calc Af Amer: >60 mL/min (ref >60)

## 2010-10-28 LAB — CBC
MCHC: 34.4 g/dL (ref 30.0–36.0)
MCV: 89 fL (ref 78.0–100.0)
RBC: 4.08 MIL/uL (ref 3.87–5.11)
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
## 2010-12-07 NOTE — Discharge Summary (Signed)
[facility]  Patient:    [name], [name] Visit Number: [id number] MRN: [id number]          Service Type: MED Location: 3W [id number] [id number] Attending Physician:  Milly Jakob Dictated by:   Currie Paris Thedore Mins. Admit Date:  04/13/2001 Disc. Date: 04/16/01   CC:         Dr. Cyndie Chime, [facility]   Discharge Summary  ADMISSION DIAGNOSES: 1. Intractable left hip pain. 2. Rule out occult fracture, left hip. 3. End-stage degenerative joint disease, left hip. 4. Schizophrenia. 5. Dementia. 6. Hypotension.  DISCHARGE DIAGNOSES: 1. Intractable left hip pain. 2. End-stage degenerative joint disease, left hip. 3. Schizophrenia. 4. Dementia. 5. Hypotension.  BRIEF HISTORY:  [name] is a 77 year old white female who lives at [facility].  She has a long history of left hip pain, and she apparently had a fall and came to the emergency room complaining of pain in the left hip.  The [facility] Emergency Room was unable to control the pain and called [place], as we were on call for orthopedics, for evaluation of her left hip pain and to gain pain control.  The patient does have a son who lives in the area but was unavailable at the time of admission. Plain x-rays of the left hip showed severe DJD with protrusio acetabulum with no evidence of an acute fracture or other acute process going on.  The patient was unable to ambulate secondary to left hip and, therefore, was admitted to the hospital for evaluation and further workup as to the source of her symptoms.  LABORATORY DATA:  Pertinent laboratory studies included CMET entirely within normal limits with slightly elevated glucose at 118, potassium 4.0, sodium 138.  Her pro time was 12.8 seconds with an INR of 1.0.  PTT was 31 seconds. WBC count 5.3, hemoglobin 13.5, hematocrit 38.2.  Differential was essentially within normal limits.  CK-MB indices  were within normal limits.  Troponin I was within normal limits.  EKG on admission showed normal sinus rhythm with lateral infarct, age undetermined.  Chest x-ray showed no active disease.  CT scan of the head was negative for acute abnormalities.  It is of note that her cardiac enzymes were negative.  RPR, vitamin B12, and TSH levels were within normal limits.  CONSULTATIONS:  [employer], Dr. Anastasio Auerbach, and Dr. Nolon Rod Monguilod.  PROCEDURES:  CT scan of the head which was negative and also MRI of the left hip, negative for acute process.  HOSPITAL COURSE:  The patient was admitted through the emergency room to Dr. Luiz Blare service with debilitation left hip pain and inability to ambulate secondary to left hip pain.  She has a known history of dementia with schizophrenia and significant agitation and is on Zyprexa for this as well as Ativan.  IV fluids were instituted, although she did pull out the IV.  She had continued moderate complaints of hip pain which were significantly improved the day after the admission.  An MRI was obtained of the left hip that showed no evidence of an acute fracture or acute process going on, but there was noted to be significant degenerative joint disease of the left hip.  She was evaluated medically by Dr. Sheppard Penton and Dr. Jamie Brookes, and they did not feel that she had a medical reason for any possible syncope, and apparently her fall in retrospect did not involve a syncopal episode.  On April 16, 2001, the patient was getting along fairly well.  She was able to ambulate with physical therapy with a walker, had minimal complaints of left hip pain.  The Foley catheter that had been placed at the time of admission was discontinued.  She seemed to have much improved left hip range of motion, although it was somewhat limited, but she did not have pain with motion of her left hip at this point.  She will be discharged back to assisted  living.  Physical therapy evaluation was done while in hospital.  She will need outpatient physical therapy three times a week for walker ambulation, weightbearing as tolerated on the left.  CONDITION UPON DISCHARGE:  Improved.  DIET:  Regular.  DISCHARGE MEDICATIONS: 1. Zyprexa 20 mg q.h.s. 2. Ativan 1 mg q.6h. p.r.n. agitation.  FOLLOWUP:  Will followup with the patient in Dr. Luiz Blare office in two weeks. We did discuss the case with the patients son and did not feel she was ready emotionally for from mental status to proceed with a total hip replacement during this hospitalization, although this may be a consideration in the future. Dictated by:   Currie Paris Thedore Mins. Attending Physician:  Milly Jakob DD:  04/15/01 TD:  04/15/01 Job: [id number]

## 2010-12-07 NOTE — Op Note (Signed)
   [name], [name]                              ACCOUNT NO.:  [id number]   MEDICAL RECORD NO.:  [id number]                   PATIENT TYPE:  INP   LOCATION:  2110                                 FACILITY:  [facility]   PHYSICIAN:  Claudette Laws, M.D.               DATE OF BIRTH:  1927-02-14   DATE OF PROCEDURE:  10/29/2002  DATE OF DISCHARGE:                                 OPERATIVE REPORT   PREOPERATIVE DIAGNOSIS:  Obstructing right renal calculus with septic shock.   POSTOPERATIVE DIAGNOSIS:  Obstructing right renal calculus with septic  shock.   OPERATION:  Cystoscopy, right pyelogram and insertion of a 6 French 26 cm  double-J stent.   SURGEON:  Claudette Laws, M.D.   HISTORY:  This is a 77 year old lady who was admitted to the teaching  service yesterday with septic shock, abdominal pain from renal insufficiency  with a creatinine of 1.8. A CT scan was done late last  night showing 11-mm  stone obstructing the right renal pelvis. I went over the x-rays with the  radiologist. I discussed the situation with the teaching service and we  thought we ought to go right ahead and put up a double-J stent and  decompress the kidney.   The patient is delirious and unable to really give a history herself. I  requested that the son obtain the operation permit or declare this an  emergency.   DESCRIPTION OF PROCEDURE:  The patient was prepped and draped in the dorsal  lithotomy position under intubated general anesthesia. A cystoscopy was  performed. She had a heavily trabeculated bladder with catheter cystitis.   Initially I had difficulty identifying the right ureteral orifice, but  eventually I was able to intubate the ureter with a 6 [place] open-ended  ureteral catheter, and then we passed up a guide wire. We obtained  retrograde studies showing a right hydronephrosis.   I then backed out the open-ended catheter and passed up a 6 [place] 26-cm  double-J stent using fluoroscopic  control. The proximal end was curled up in  the upper pole calyx. The distal end was curled up in the bladder. Also  during the procedure she had a hydronephrotic drip of grossly purulent urine  which we cultured. I replaced her Foley catheter with an 24 French, 10 cc  catheter hooked up to her drainage bag.   The patient was  then taken back to the ICU. She tolerated the procedure  well.                                               Claudette Laws, M.D.    RFS/MEDQ  D:  10/29/2002  T:  10/30/2002  Job:  [id number]

## 2011-04-18 LAB — URINALYSIS, ROUTINE W REFLEX MICROSCOPIC
Bilirubin Urine: NEGATIVE
Hgb urine dipstick: NEGATIVE
Ketones, ur: NEGATIVE
Protein, ur: NEGATIVE
Urobilinogen, UA: 0.2
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
## 2011-08-18 IMAGING — CR DG CHEST 1V PORT
1 series · 1 of 1 positions shown · non-contrast
Comparison: Chest 08/28/2010.

CLINICAL DATA: Acute onset shortness of breath.

PORTABLE CHEST - 1 VIEW

[AP]
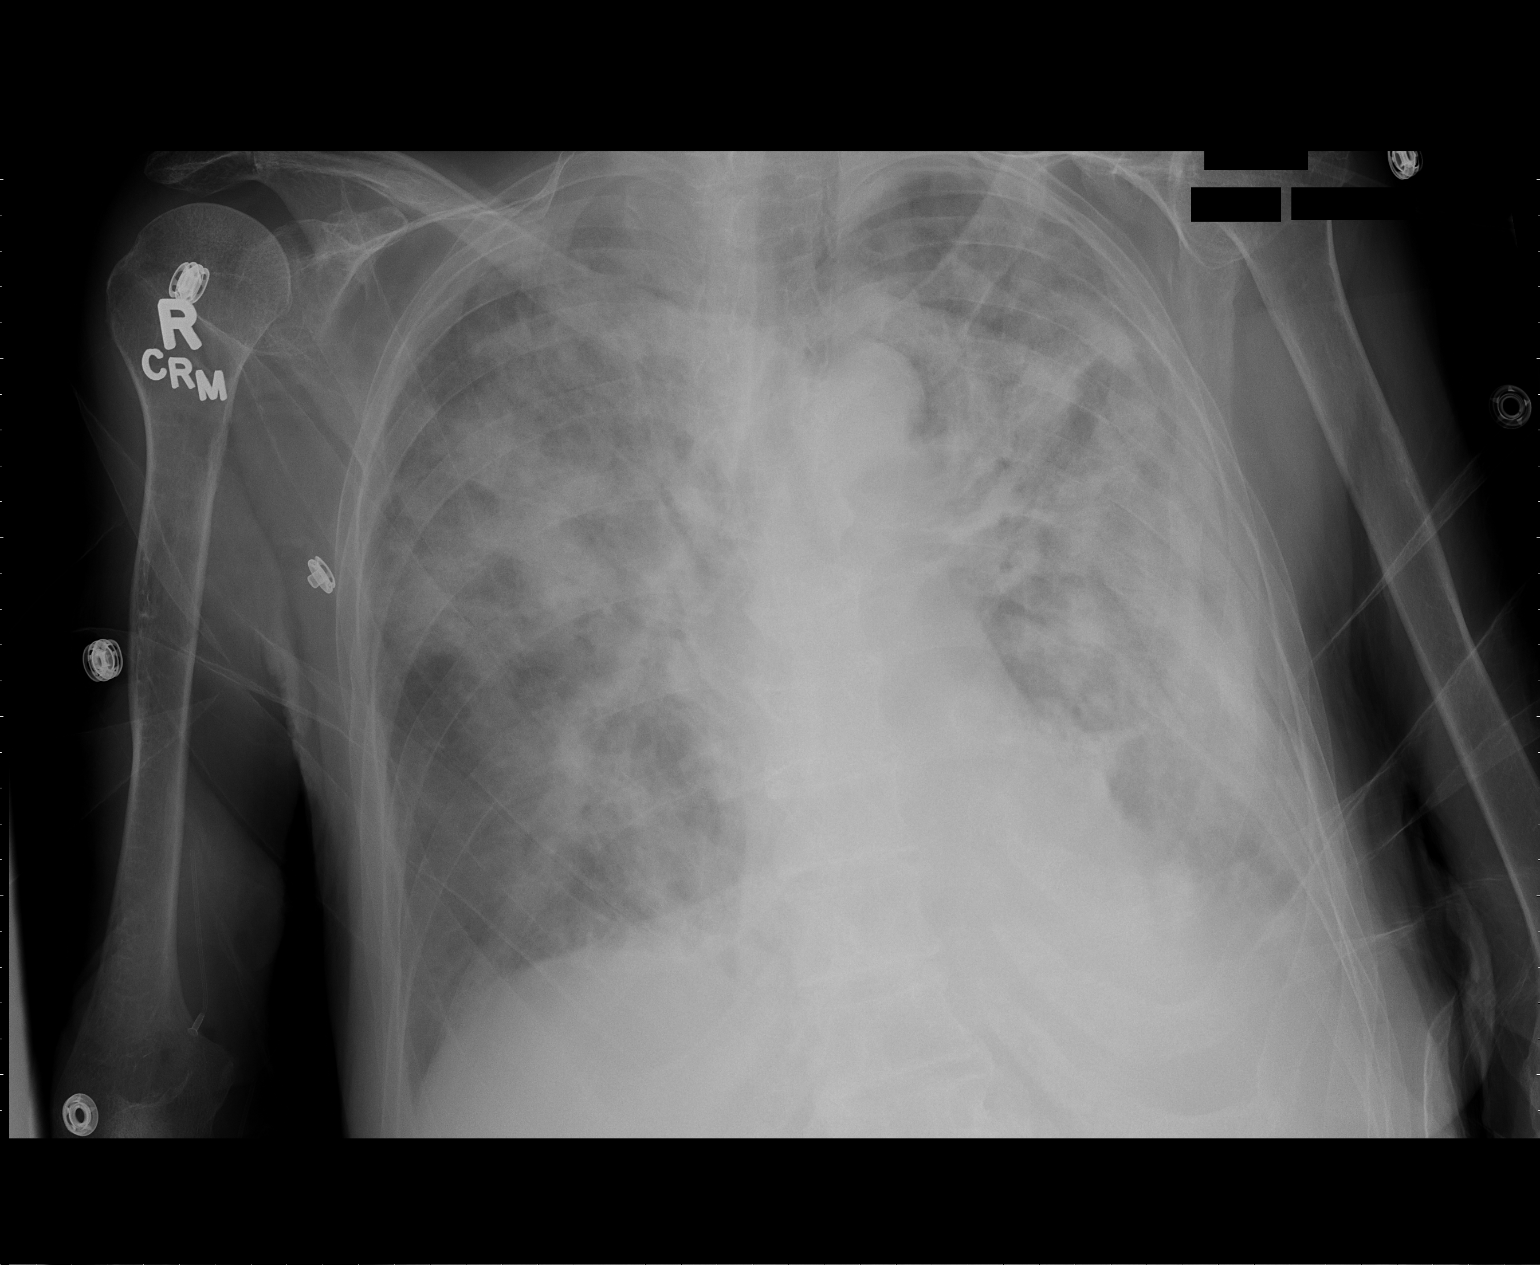

[1 of 1 positions shown; findings below may reference images not displayed]

FINDINGS: NG tube has been removed.  Bilateral airspace disease
shows marked worsening.  There are likely bilateral pleural
effusions.  Heart size normal.
IMPRESSION: Marked worsening of bilateral airspace disease compatible with
progressive edema and/or pneumonia.

## 2012-10-07 ENCOUNTER — Other Ambulatory Visit: Payer: Self-pay | Admitting: *Deleted

## 2012-10-07 ENCOUNTER — Encounter: Payer: Self-pay | Admitting: Adult Health

## 2012-10-07 ENCOUNTER — Non-Acute Institutional Stay (SKILLED_NURSING_FACILITY): Payer: Medicare Other | Admitting: Adult Health

## 2012-10-07 DIAGNOSIS — F0150 Vascular dementia without behavioral disturbance: Secondary | ICD-10-CM

## 2012-10-07 DIAGNOSIS — F329 Major depressive disorder, single episode, unspecified: Secondary | ICD-10-CM

## 2012-10-07 DIAGNOSIS — F32A Depression, unspecified: Secondary | ICD-10-CM | POA: Insufficient documentation

## 2012-10-07 DIAGNOSIS — Z66 Do not resuscitate: Secondary | ICD-10-CM

## 2012-10-07 DIAGNOSIS — G8929 Other chronic pain: Secondary | ICD-10-CM | POA: Insufficient documentation

## 2012-10-07 DIAGNOSIS — K5900 Constipation, unspecified: Secondary | ICD-10-CM | POA: Insufficient documentation

## 2012-10-07 MED ORDER — LORAZEPAM 1 MG PO TABS: ORAL_TABLET | ORAL | Status: DC

## 2012-10-07 NOTE — Assessment & Plan Note (Signed)
She no overall status change she is not taking any medications

## 2012-10-07 NOTE — Assessment & Plan Note (Signed)
There are no indications that she is in pain; she has roxanol 10 mg every 4 hours as needed

## 2012-10-07 NOTE — Assessment & Plan Note (Signed)
She is presently stable and is taking senna s daily

## 2012-10-07 NOTE — Progress Notes (Signed)
Subjective:     Patient ID: [name], female   DOB: [birth year], 79 y.o.   MRN: [id number] Chief Complaint  Patient presents with  . Medical Managment of Chronic Issues    HPI Depression She is on long term therapy of lexapro 10 mg daily and has ativan 1 mg every 6 hours as needed for anxiety.   Dementia, multi-infarct She no overall status change she is not taking any medications  Unspecified constipation She is presently stable and is taking senna s daily   Pain, chronic There are no indications that she is in pain; she has roxanol 10 mg every 4 hours as needed   past procedures: None recent   Current outpatient prescriptions:escitalopram (LEXAPRO) 10 MG tablet, Take 10 mg by mouth daily., Disp: , Rfl: ;  LORazepam (ATIVAN) 1 MG tablet, Take one tablet every 6 hours as needed for anxiety, Disp: 120 tablet, Rfl: 5;  morphine (ROXANOL) 20 MG/ML concentrated solution, Take 10 mg by mouth every 4 (four) hours as needed for pain., Disp: , Rfl: ;  senna-docusate (SENOKOT-S) 8.6-50 MG per tablet, Take 1 tablet by mouth daily., Disp: , Rfl:  Review of Systems  Unable to perform ROS  Filed Vitals:   10/07/12 2201  Height: 5\' 5"  (1.651 m)  Weight: 98 lb (44.453 kg)      Filed Vitals:   10/07/12 2201  BP: 130/68  Pulse: 78   Objective:   Physical Exam  Constitutional:  She is frail appearing  HENT:  Head: Normocephalic.  Neck: Neck supple.  Cardiovascular: Normal rate, regular rhythm, normal heart sounds and intact distal pulses.   Pulmonary/Chest: Effort normal and breath sounds normal.  Abdominal: Soft. Bowel sounds are normal.  Musculoskeletal:  Is out of bed to wheelchair; is able to move extremties; has stiffness present   Neurological: She is alert.  Is not oriented to surroundings  Skin: Skin is warm and dry.  Psychiatric: Her behavior is normal.   Lab reviewed:  11-22-11: wbc 4.4; hgb 12.9; hct 39.3; mcv90.6; plt 340; glucose 99; bun 18; creat 0.62 K+ 4.0; na++  142; liver normal albumin 3.2     Assessment:     Patient Active Problem List  Diagnosis  . Depression  . Dementia, multi-infarct  . Unspecified constipation  . Pain, chronic      Plan:   will not make any changes in her medication regimen and will continue to monitor her status

## 2012-10-07 NOTE — Assessment & Plan Note (Signed)
She is on long term therapy of lexapro 10 mg daily and has ativan 1 mg every 6 hours as needed for anxiety.

## 2012-11-25 ENCOUNTER — Non-Acute Institutional Stay (SKILLED_NURSING_FACILITY): Payer: Medicare Other | Admitting: Adult Health

## 2012-11-25 DIAGNOSIS — G8929 Other chronic pain: Secondary | ICD-10-CM

## 2012-11-25 DIAGNOSIS — F0150 Vascular dementia without behavioral disturbance: Secondary | ICD-10-CM

## 2012-11-25 DIAGNOSIS — F329 Major depressive disorder, single episode, unspecified: Secondary | ICD-10-CM

## 2012-12-09 ENCOUNTER — Encounter: Payer: Self-pay | Admitting: Adult Health

## 2012-12-09 NOTE — Assessment & Plan Note (Signed)
Will continue her lexapro 10 mg daily her ativan was stopped for nonuse

## 2012-12-09 NOTE — Assessment & Plan Note (Signed)
There is no significant change in her overall status; she does not take medication for this disease process; will monitor her status

## 2012-12-09 NOTE — Assessment & Plan Note (Signed)
Will continue her roxanol 10 mg every 4 hours as needed for pain will monitor her status

## 2012-12-09 NOTE — Progress Notes (Signed)
Patient ID: [name], female   DOB: 1927-02-12, 79 y.o.   MRN: [id number]  FACILITY: [facility]   No Known Allergies   Chief Complaint  Patient presents with  . Medical Managment of Chronic Issues    HPI: She is being seen for the management of her chronic illnesses; there are no reports of any concerns given; there is no overall change in her status. She is unable to participate in the hpi or ros.   Past Medical History  Diagnosis Date  . Depression   . Depression   . Dementia, multi-infarct   . Constipation     No past surgical history on file.  VITAL SIGNS BP 159/72  Pulse 68  Ht 5\' 5"  (1.651 m)  Wt 101 lb (45.813 kg)  BMI 16.81 kg/m2   Patient's Medications  New Prescriptions   No medications on file  Previous Medications   ESCITALOPRAM (LEXAPRO) 10 MG TABLET    Take 10 mg by mouth daily.   MORPHINE (ROXANOL) 20 MG/ML CONCENTRATED SOLUTION    Take 10 mg by mouth every 4 (four) hours as needed for pain.   SENNA-DOCUSATE (SENOKOT-S) 8.6-50 MG PER TABLET    Take 1 tablet by mouth daily.  Modified Medications   No medications on file  Discontinued Medications   LORAZEPAM (ATIVAN) 1 MG TABLET    Take one tablet every 6 hours as needed for anxiety    SIGNIFICANT DIAGNOSTIC EXAMS   Lab reviewed:  11-22-11: wbc 4.4; hgb 12.9; hct 39.3; mcv90.6; plt 340; glucose 99; bun 18; creat 0.62 K+ 4.0; na++ 142; liver normal albumin 3.2    Review of Systems  Unable to perform ROS    Physical Exam  Constitutional:  She is frail appearing  HENT:  Head: Normocephalic.  Neck: Neck supple.  Cardiovascular: Normal rate, regular rhythm, normal heart sounds and intact distal pulses.   Pulmonary/Chest: Effort normal and breath sounds normal.  Abdominal: Soft. Bowel sounds are normal.  Musculoskeletal:  Is out of bed to wheelchair; is able to move extremties; has stiffness present   Neurological: She is alert.  Is not oriented to surroundings  Skin: Skin is  warm and dry.  Psychiatric: Her behavior is normal.    ASSESSMENT/ PLAN:  Dementia, multi-infarct There is no significant change in her overall status; she does not take medication for this disease process; will monitor her status   Depression Will continue her lexapro 10 mg daily her ativan was stopped for nonuse  Pain, chronic Will continue her roxanol 10 mg every 4 hours as needed for pain will monitor her status

## 2013-02-22 ENCOUNTER — Encounter: Payer: Self-pay | Admitting: *Deleted

## 2013-03-04 ENCOUNTER — Non-Acute Institutional Stay (SKILLED_NURSING_FACILITY): Payer: Medicare Other | Admitting: Internal Medicine

## 2013-03-04 DIAGNOSIS — G8929 Other chronic pain: Secondary | ICD-10-CM

## 2013-03-04 DIAGNOSIS — F329 Major depressive disorder, single episode, unspecified: Secondary | ICD-10-CM

## 2013-03-04 DIAGNOSIS — E875 Hyperkalemia: Secondary | ICD-10-CM | POA: Insufficient documentation

## 2013-03-04 DIAGNOSIS — F0150 Vascular dementia without behavioral disturbance: Secondary | ICD-10-CM

## 2013-03-04 NOTE — Progress Notes (Signed)
Patient ID: [name], female   DOB: 04/11/1927, 79 y.o.   MRN: [id number]    [name] living [place]  Code Status: DNR  No Known Allergies  Chief Complaint  Patient presents with  . Medical Managment of Chronic Issues    HPI:  79 y/o female patient is long term care resident. Comfort care is the main goal of care. She is in no distress and appears comfortable. She is under total care  Review of Systems:  Unable to obtain ROS No concerns from staff  Past Medical History  Diagnosis Date  . Depression 02/19/2010  . Dementia, multi-infarct 12/22/2009  . Constipation 03/29/2010  . Other and unspecified hyperlipidemia 02/19/2010  . Reflux esophagitis 03/29/2010  . Unspecified disorder of liver 07/02/2010  . Adult failure to thrive 08/27/2010  . Pyelonephritis, unspecified 09/30/2010  . 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 Social History:   reports that she has never smoked. She does not have any smokeless tobacco history on file. She reports that she does not drink alcohol or use illicit drugs.  No family history on file.  Medications: Patient's Medications  New Prescriptions   No medications on file  Previous Medications   ESCITALOPRAM (LEXAPRO) 10 MG TABLET    Take 10 mg by mouth daily.   MORPHINE (ROXANOL) 20 MG/ML CONCENTRATED SOLUTION    Take 10 mg by mouth every 4 (four) hours as needed for pain. Give 0.53ml(10MG ) by mouth every 4 hours as needed for pain.   MULTIPLE VITAMIN (MULTIVITAMIN) TABLET    Take 1 tablet by mouth daily.   SENNA-DOCUSATE (SENOKOT-S) 8.6-50 MG PER TABLET    Take 1 tablet by mouth daily.  Modified Medications   No medications on file  Discontinued Medications   LORAZEPAM (ATIVAN) 1 MG TABLET    Take 1 mg by mouth every 6 (six) hours as needed for anxiety. Take 1 tablet every 6 hours for anxiety.     Physical Exam:  Filed Vitals:   03/04/13 1600  BP: 122/69  Pulse: 82  Temp: 97.8 F (36.6 C)  Resp: 19   Height: 5\' 5"  (1.651 m)  Weight: 103 lb (46.72 kg)  SpO2: 94%   Physical Exam  Constitutional:  She is frail appearing  HENT:   Head: Normocephalic.  Neck: Neck supple.  Cardiovascular: Normal rate, regular rhythm, normal heart sounds and intact distal pulses.   Pulmonary/Chest: Effort normal and breath sounds normal.  Abdominal: Soft. Bowel sounds are normal.  Musculoskeletal:  Is out of bed to wheelchair; is able to move extremties; has stiffness present   Neurological: She is alert.  Is not oriented to surroundings  Skin: Skin is warm and dry.  Psychiatric: Her behavior is normal.    Labs reviewed 11-22-11: wbc 4.4; hgb 12.9; hct 39.3; mcv90.6; plt 340; glucose 99; bun 18; creat 0.62 K+ 4.0; na++ 142; liver normal albumin 3.2   11-27-12 wbc 3.7, hb 14.1, hct 41.2, plt 427, na 139, k 6.2, glu 59, bun 37, cr 1.8, lft wnl  ASSESSMENT/ PLAN:  Dementia, multi-infarct There is no significant change in her overall status. Currently off all medication. bp stable. Monitor. Continue total care for now  Hyperkalemia Check bmp  Depression continue her lexapro 10 mg daily   Pain, chronic Will continue her roxanol 10 mg every 4 hours as needed for pain and will monitor her status   Comfort care is the goal of care here  Labs/tests ordered- bmp

## 2013-04-02 ENCOUNTER — Non-Acute Institutional Stay (SKILLED_NURSING_FACILITY): Payer: Medicare Other | Admitting: Internal Medicine

## 2013-04-02 DIAGNOSIS — F0150 Vascular dementia without behavioral disturbance: Secondary | ICD-10-CM

## 2013-04-02 DIAGNOSIS — F3289 Other specified depressive episodes: Secondary | ICD-10-CM

## 2013-04-02 DIAGNOSIS — K5900 Constipation, unspecified: Secondary | ICD-10-CM

## 2013-04-02 DIAGNOSIS — F329 Major depressive disorder, single episode, unspecified: Secondary | ICD-10-CM

## 2013-04-02 DIAGNOSIS — G8929 Other chronic pain: Secondary | ICD-10-CM

## 2013-04-02 NOTE — Progress Notes (Signed)
Patient ID: [name], female   DOB: 1927-07-09, 79 y.o.   MRN: [id number]  [name] living [place]  Code Status: DNR  No Known Allergies  Chief Complaint  Patient presents with  . Medical Managment of Chronic Issues    HPI:   79 y/o female patient is long term care resident. Comfort care is the main goal of care. She is in no distress and appears comfortable. She is under total care  Review of Systems:  Unable to obtain ROS No concerns from staff  Past Medical History  Diagnosis Date  . Depression 02/19/2010  . Dementia, multi-infarct 12/22/2009  . Constipation 03/29/2010  . Other and unspecified hyperlipidemia 02/19/2010  . Reflux esophagitis 03/29/2010  . Unspecified disorder of liver 07/02/2010  . Adult failure to thrive 08/27/2010  . Pyelonephritis, unspecified 09/30/2010  . 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 escitalopram (LEXAPRO) 10 MG tablet Take 10 mg by mouth daily.      Marland Kitchen morphine (ROXANOL) 20 MG/ML concentrated solution Take 10 mg by mouth every 4 (four) hours as needed for pain. Give 0.30ml(10MG ) by mouth every 4 hours as needed for pain.      . Multiple Vitamin (MULTIVITAMIN) tablet Take 1 tablet by mouth daily.      Marland Kitchen senna-docusate (SENOKOT-S) 8.6-50 MG per tablet Take 1 tablet by mouth daily.       No current facility-administered medications on file prior to visit.    Physical exam-  Vitals and nursing notes reviewed   Constitutional:  She is frail appearing   HENT:   Head: Normocephalic.   Neck: Neck supple.   Cardiovascular: Normal rate, regular rhythm, normal heart sounds and intact distal pulses.    Pulmonary/Chest: Effort normal and breath sounds normal.   Abdominal: Soft. Bowel sounds are normal.  Musculoskeletal:  Is out of bed to wheelchair; is able to move extremties; has stiffness present   Neurological: She is alert.  Is not oriented to surroundings    Skin: Skin is warm and dry.  Psychiatric: Her behavior is normal.    Labs reviewed 11-22-11: wbc 4.4; hgb 12.9; hct 39.3; mcv90.6; plt 340; glucose 99; bun 18; creat 0.62 K+ 4.0; na++ 142; liver normal albumin 3.2   11-27-12 wbc 3.7, hb 14.1, hct 41.2, plt 427, na 139, k 6.2, glu 59, bun 37, cr 1.8, lft wnl  ASSESSMENT/ PLAN:  Pain, chronic Will continue her roxanol 10 mg every 4 hours as needed for pain and will monitor her status. Currently is comfortable  Dementia, multi-infarct No new behavior changes. Currently off all medication. continue total care for now  Depression continue her lexapro 10 mg daily   Constipation Continue senna-s for now, bowel movement is stable

## 2013-05-06 ENCOUNTER — Non-Acute Institutional Stay (SKILLED_NURSING_FACILITY): Payer: Medicare Other | Admitting: Internal Medicine

## 2013-05-06 ENCOUNTER — Encounter: Payer: Self-pay | Admitting: Internal Medicine

## 2013-05-06 DIAGNOSIS — F0150 Vascular dementia without behavioral disturbance: Secondary | ICD-10-CM

## 2013-05-06 DIAGNOSIS — G8929 Other chronic pain: Secondary | ICD-10-CM

## 2013-05-06 DIAGNOSIS — F329 Major depressive disorder, single episode, unspecified: Secondary | ICD-10-CM

## 2013-05-06 NOTE — Progress Notes (Signed)
Patient ID: [name], female   DOB: 01/09/1927, 79 y.o.   MRN: [id number]  [name] living [place]  Code Status: DNR  No Known Allergies  CC- medical management of chronic issues  HPI:   79 y/o female patient is long term care resident. Comfort care is the main goal of care. She is in no distress and appears comfortable. She is under total care. No changes since her last visit  Review of Systems:  Unable to obtain ROS No concerns from staff  Past Medical History  Diagnosis Date  . Depression 02/19/2010  . Dementia, multi-infarct 12/22/2009  . Constipation 03/29/2010  . Other and unspecified hyperlipidemia 02/19/2010  . Reflux esophagitis 03/29/2010  . Unspecified disorder of liver 07/02/2010  . Adult failure to thrive 08/27/2010  . Pyelonephritis, unspecified 09/30/2010  . 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 escitalopram (LEXAPRO) 10 MG tablet Take 10 mg by mouth daily.      Marland Kitchen morphine (ROXANOL) 20 MG/ML concentrated solution Take 10 mg by mouth every 4 (four) hours as needed for pain. Give 0.70ml(10MG ) by mouth every 4 hours as needed for pain.      . Multiple Vitamin (MULTIVITAMIN) tablet Take 1 tablet by mouth daily.      Marland Kitchen senna-docusate (SENOKOT-S) 8.6-50 MG per tablet Take 1 tablet by mouth daily.       No current facility-administered medications on file prior to visit.    Physical exam- BP 100/76  Pulse 62  Temp(Src) 98.1 F (36.7 C)  Resp 18  Ht 5\' 5"  (1.651 m)  Wt 100 lb (45.36 kg)  BMI 16.64 kg/m2  SpO2 94%  Vitals and nursing notes reviewed   Constitutional:  She is frail appearing   HENT:   Head: Normocephalic.   Neck: Neck supple.   Cardiovascular: Normal rate, regular rhythm, normal heart sounds and intact distal pulses.    Pulmonary/Chest: Effort normal and breath sounds normal.   Abdominal: Soft. Bowel sounds are normal.  Musculoskeletal:  Is out of bed to  wheelchair; is able to move extremties; has stiffness present   Neurological: She is alert.  Is not oriented to surroundings   Skin: Skin is warm and dry.  Psychiatric: Her behavior is normal.   Labs reviewed 11-22-11: wbc 4.4; hgb 12.9; hct 39.3; mcv90.6; plt 340; glucose 99; bun 18; creat 0.62 K+ 4.0; na++ 142; liver normal albumin 3.2   11-27-12 wbc 3.7, hb 14.1, hct 41.2, plt 427, na 139, k 6.2, glu 59, bun 37, cr 1.8, lft wnl  ASSESSMENT/ PLAN:  Pain, chronic Will continue her roxanol 10 mg every 4 hours as needed for pain and will monitor her status. Currently is comfortable  Depression continue her lexapro 10 mg daily and mood stable for now  Dementia, multi-infarct No new behavior changes. Currently off all medication. continue total care for now  Constipation Continue senna-s for now, bowel movement is stable

## 2013-06-15 ENCOUNTER — Non-Acute Institutional Stay (SKILLED_NURSING_FACILITY): Payer: Medicare Other | Admitting: Internal Medicine

## 2013-06-15 ENCOUNTER — Encounter: Payer: Self-pay | Admitting: Internal Medicine

## 2013-06-15 DIAGNOSIS — B86 Scabies: Secondary | ICD-10-CM

## 2013-06-15 NOTE — Progress Notes (Signed)
Patient ID: Aliyanah Rozas, female   DOB: 11-18-1926, 79 y.o.   MRN: [id number]  Location:  [facility] Provider:  Gwenith Spitz. Renato Gails, D.O., C.M.D.  Code Status:  DNR, comfort measures  Chief Complaint  Patient presents with  . Acute Visit    rash on trunk and arms with linear raised pattern, pruritic   HPI:  79 yo white female with advanced dementia on comfort measures was seen today due to unresolving rash on her trunk and upper extremities.  She has been itching it frequently and has excoriations over her arms, as well.   Review of Systems:  Review of Systems  Unable to perform ROS: dementia    Medications: Patient's Medications  New Prescriptions   No medications on file  Previous Medications   ESCITALOPRAM (LEXAPRO) 10 MG TABLET    Take 10 mg by mouth daily.   MORPHINE (ROXANOL) 20 MG/ML CONCENTRATED SOLUTION    Take 10 mg by mouth every 4 (four) hours as needed for pain. Give 0.39ml(10MG ) by mouth every 4 hours as needed for pain.   MULTIPLE VITAMIN (MULTIVITAMIN) TABLET    Take 1 tablet by mouth daily.   SENNA-DOCUSATE (SENOKOT-S) 8.6-50 MG PER TABLET    Take 1 tablet by mouth daily.  Modified Medications   No medications on file  Discontinued Medications   No medications on file    Physical Exam: Filed Vitals:   06/15/13 1451  BP: 117/76  Pulse: 90  Temp: 97.3 F (36.3 C)  Resp: 18  Height: 5\' 5"  (1.651 m)  Weight: 100 lb (45.36 kg)  SpO2: 94%  Physical Exam  Constitutional:  Thin white female seated in wheelchair, wearing onesie  Cardiovascular: Normal rate, regular rhythm, normal heart sounds and intact distal pulses.   Pulmonary/Chest: Effort normal and breath sounds normal. No respiratory distress.  Abdominal: Soft. Bowel sounds are normal.  Neurological: She is alert.  Skin: Skin is warm and dry.  Raised papular rash with vesicles in a linear fashion over trunk and upper extremities with evidence of excoriation   Assessment/Plan 1.  Scabies--suspected -treat with permethrin cream daily for 72 hours then 2x weekly until resolution -keep isolated from other residents and in contact precautions -wash her clothes and linens in hot water separate from other residents after storing them in a sealed bag for 72 hours -has not responded to topical steroid therapy  Family/ staff Communication: discussed with unit supervisor who was present for the visit  Goals of care: DNR, comfort care

## 2013-07-13 ENCOUNTER — Encounter: Payer: Self-pay | Admitting: Internal Medicine

## 2013-07-13 ENCOUNTER — Non-Acute Institutional Stay (SKILLED_NURSING_FACILITY): Payer: Medicare Other | Admitting: Internal Medicine

## 2013-07-13 DIAGNOSIS — F3289 Other specified depressive episodes: Secondary | ICD-10-CM

## 2013-07-13 DIAGNOSIS — F0150 Vascular dementia without behavioral disturbance: Secondary | ICD-10-CM

## 2013-07-13 DIAGNOSIS — G8929 Other chronic pain: Secondary | ICD-10-CM

## 2013-07-13 DIAGNOSIS — F32A Depression, unspecified: Secondary | ICD-10-CM

## 2013-07-13 DIAGNOSIS — K5900 Constipation, unspecified: Secondary | ICD-10-CM

## 2013-07-13 DIAGNOSIS — F329 Major depressive disorder, single episode, unspecified: Secondary | ICD-10-CM

## 2013-07-13 NOTE — Progress Notes (Signed)
Patient ID: [name], female   DOB: 1926-11-17, 79 y.o.   MRN: [id number]  Location:  [facility] Provider:  Gwenith Spitz. Renato Gails, D.O., C.M.D.  Code Status:  DNR, hospice with [facility]   Chief Complaint  Patient presents with  . Medical Managment of Chronic Issues    hospice pt seen for routine visit    HPI:  79 yo white female here for long term care who is on hospice for dementia was seen for routine visit.  Staff have no new concerns about her at this point.  She is nonverbal, bedbound, contracted, requires full assist with all ADLs.  She has been moving her bowels, bladder working ok and staff have not noted new s/s of pain or discomfort.  She frequently is seen masturbating.  Review of Systems:  Review of Systems  Unable to perform ROS: dementia    Medications: Patient's Medications  New Prescriptions   No medications on file  Previous Medications   ESCITALOPRAM (LEXAPRO) 10 MG TABLET    Take 10 mg by mouth daily.   MORPHINE (ROXANOL) 20 MG/ML CONCENTRATED SOLUTION    Take 10 mg by mouth every 4 (four) hours as needed for pain. Give 0.57ml(10MG ) by mouth every 4 hours as needed for pain.   MULTIPLE VITAMIN (MULTIVITAMIN) TABLET    Take 1 tablet by mouth daily.   SENNA-DOCUSATE (SENOKOT-S) 8.6-50 MG PER TABLET    Take 1 tablet by mouth daily.  Modified Medications   No medications on file  Discontinued Medications   No medications on file    Physical Exam: Filed Vitals:   07/13/13 1415  BP: 134/89  Pulse: 86  Temp: 98.4 F (36.9 C)  Resp: 20  Height: 5\' 5"  (1.651 m)  Weight: 100 lb (45.36 kg)  SpO2: 94%  Physical Exam  Constitutional:  Cachectic white female resting in bed  Cardiovascular: Normal rate, regular rhythm, normal heart sounds and intact distal pulses.   Pulmonary/Chest: Effort normal and breath sounds normal. No respiratory distress.  Abdominal: Soft. Bowel sounds are normal. She exhibits no distension and no mass. There is no tenderness.    Musculoskeletal:  Contractures of upper and lower extremities  Neurological:  Opens eyes, but does not otherwise interact meaningfully  Skin: Skin is warm and dry. There is pallor.    Assessment/Plan 1. Dementia, multi-infarct With failure to thrive End stage on hospice care  Cont comfort measures  2. Depression -cont lexapro  3. Pain, chronic -cont roxanol 10mg  po q 4hrs prn pain or dyspnea  4. Unspecified constipation -cont senna-s  Family/ staff Communication: seen with unit supervisor  Goals of care: hospice long term care resident  Labs/tests ordered:  none

## 2013-08-17 ENCOUNTER — Encounter: Payer: Self-pay | Admitting: Internal Medicine

## 2013-08-17 ENCOUNTER — Non-Acute Institutional Stay (SKILLED_NURSING_FACILITY): Payer: Medicare Other | Admitting: Internal Medicine

## 2013-08-17 DIAGNOSIS — K5909 Other constipation: Secondary | ICD-10-CM

## 2013-08-17 DIAGNOSIS — F329 Major depressive disorder, single episode, unspecified: Secondary | ICD-10-CM

## 2013-08-17 DIAGNOSIS — F3289 Other specified depressive episodes: Secondary | ICD-10-CM

## 2013-08-17 DIAGNOSIS — K5900 Constipation, unspecified: Secondary | ICD-10-CM

## 2013-08-17 DIAGNOSIS — F32A Depression, unspecified: Secondary | ICD-10-CM

## 2013-08-17 DIAGNOSIS — F0150 Vascular dementia without behavioral disturbance: Secondary | ICD-10-CM

## 2013-08-17 DIAGNOSIS — G8929 Other chronic pain: Secondary | ICD-10-CM

## 2013-08-17 NOTE — Progress Notes (Signed)
Patient ID: [name], female   DOB: 07/15/1927, 80 y.o.   MRN: [id number]  [facility] Provider:  Gwenith Spitziffany L. Renato Gailseed, D.O., C.M.D.  Code Status:  DNR  Chief Complaint  Patient presents with  . Medical Managment of Chronic Issues    HPI:  80 yo female with end stage dementia here for long term care.  She is receiving hospice services.  She continues with her tendencies to masturbate and is itching her arms today when she cannot reach these areas while in a one-piece outfit.  She is nonverbal, contracted, and mostly bedbound.  Staff have no other concerns with her at this time.  Review of Systems:  Review of Systems  Unable to perform ROS: dementia    Medications: Patient's Medications  New Prescriptions   No medications on file  Previous Medications   ESCITALOPRAM (LEXAPRO) 10 MG TABLET    Take 10 mg by mouth daily.   MORPHINE (ROXANOL) 20 MG/ML CONCENTRATED SOLUTION    Take 10 mg by mouth every 4 (four) hours as needed for pain. Give 0.845ml(10MG ) by mouth every 4 hours as needed for pain.   MULTIPLE VITAMIN (MULTIVITAMIN) TABLET    Take 1 tablet by mouth daily.   SENNA-DOCUSATE (SENOKOT-S) 8.6-50 MG PER TABLET    Take 1 tablet by mouth daily.  Modified Medications   No medications on file  Discontinued Medications   No medications on file    Physical Exam: Filed Vitals:   08/17/13 1556  BP: 100/76  Pulse: 96  Temp: 96.9 F (36.1 C)  Resp: 18  Height: 5\' 5"  (1.651 m)  Weight: 101 lb (45.813 kg)  SpO2: 94%   Physical Exam  Constitutional:  Frail white female, contracted lower extremities and increased tone of upper extremities, also  Cardiovascular: Normal rate, regular rhythm, normal heart sounds and intact distal pulses.   Pulmonary/Chest: Effort normal and breath sounds normal. No respiratory distress.  Abdominal: Soft. Bowel sounds are normal. She exhibits no distension and no mass. There is no tenderness.  Neurological: She is alert.    Skin: Skin is warm and dry. There is pallor.   Assessment/Plan 1. Dementia, multi-infarct -continues to progress, end stage -receiving hospice for assistance with her chronic pain, contractures and constipation and to maintain comfort  2. Pain, chronic -seems controlled with roxanol 10mg  po q 4 hrs prn pain  3. Depression -controlled with lexapro 10mg  daily  4. Chronic constipation -cont senna s daily  Goals of care: DNR, hospice, comfort care, do not hospitalize

## 2013-09-08 ENCOUNTER — Other Ambulatory Visit: Payer: Self-pay | Admitting: *Deleted

## 2013-09-08 MED ORDER — MORPHINE SULFATE (CONCENTRATE) 20 MG/ML PO SOLN: ORAL | Status: AC

## 2013-09-08 NOTE — Telephone Encounter (Signed)
[facility] [place] 

## 2013-10-20 DEATH — deceased
